# Patient Record
Sex: Female | Born: 1955 | Race: Black or African American | Hispanic: No | Marital: Single
Health system: Southern US, Community
[De-identification: ages and names within clinical notes are randomized; demographics above are authoritative.]

---

## 2013-01-23 ENCOUNTER — Inpatient Hospital Stay: Payer: Self-pay | Admitting: Internal Medicine

## 2013-01-23 LAB — URINALYSIS, COMPLETE
Bilirubin,UR: NEGATIVE
Glucose,UR: NEGATIVE mg/dL (ref 0–75)
LEUKOCYTE ESTERASE: NEGATIVE
Nitrite: POSITIVE
PH: 6 (ref 4.5–8.0)
Protein: NEGATIVE
RBC,UR: 1 /HPF (ref 0–5)
Specific Gravity: 1.013 (ref 1.003–1.030)

## 2013-01-23 LAB — CBC
HCT: 40 % (ref 35.0–47.0)
HGB: 13.5 g/dL (ref 12.0–16.0)
MCH: 29.6 pg (ref 26.0–34.0)
MCHC: 33.7 g/dL (ref 32.0–36.0)
MCV: 88 fL (ref 80–100)
Platelet: 317 10*3/uL (ref 150–440)
RBC: 4.55 10*6/uL (ref 3.80–5.20)
RDW: 14.8 % — ABNORMAL HIGH (ref 11.5–14.5)
WBC: 11.5 10*3/uL — ABNORMAL HIGH (ref 3.6–11.0)

## 2013-01-23 LAB — APTT: Activated PTT: 27.5 secs (ref 23.6–35.9)

## 2013-01-23 LAB — CK TOTAL AND CKMB (NOT AT ARMC)
CK, TOTAL: 76 U/L (ref 21–215)
CK, Total: 76 U/L (ref 21–215)
CK, Total: 73 U/L (ref 21–215)
CK-MB: 0.9 ng/mL (ref 0.5–3.6)
CK-MB: 1.3 ng/mL (ref 0.5–3.6)
CK-MB: 1.4 ng/mL (ref 0.5–3.6)

## 2013-01-23 LAB — TROPONIN I
Troponin-I: <0.02 ng/mL
Troponin-I: 0.12 ng/mL — ABNORMAL HIGH
Troponin-I: 0.08 ng/mL — ABNORMAL HIGH

## 2013-01-23 LAB — COMPREHENSIVE METABOLIC PANEL
ALBUMIN: 3.6 g/dL (ref 3.4–5.0)
ALK PHOS: 83 U/L
ALT: 24 U/L (ref 12–78)
Anion Gap: 9 (ref 7–16)
BILIRUBIN TOTAL: 0.3 mg/dL (ref 0.2–1.0)
BUN: 12 mg/dL (ref 7–18)
CHLORIDE: 105 mmol/L (ref 98–107)
CO2: 23 mmol/L (ref 21–32)
Calcium, Total: 9 mg/dL (ref 8.5–10.1)
Creatinine: 0.72 mg/dL (ref 0.60–1.30)
EGFR (African American): >60
EGFR (Non-African Amer.): >60
Glucose: 160 mg/dL — ABNORMAL HIGH (ref 65–99)
Osmolality: 277 (ref 275–301)
Potassium: 3 mmol/L — ABNORMAL LOW (ref 3.5–5.1)
SGOT(AST): 17 U/L (ref 15–37)
Sodium: 137 mmol/L (ref 136–145)
Total Protein: 7.5 g/dL (ref 6.4–8.2)

## 2013-01-23 LAB — PROTIME-INR
INR: 0.9
PROTHROMBIN TIME: 12.4 s (ref 11.5–14.7)

## 2013-01-23 LAB — MAGNESIUM: MAGNESIUM: 1.7 mg/dL — AB

## 2013-01-23 LAB — HEMOGLOBIN A1C: Hemoglobin A1C: 7.2 % — ABNORMAL HIGH (ref 4.2–6.3)

## 2013-01-23 IMAGING — CR DG CHEST 2V
1 series · 3 of 3 positions shown · non-contrast
Comparison: None.

CLINICAL DATA: 57-year-old female chest pain. Initial encounter.
Palpitations.

EXAM:
CHEST  2 VIEW

[Series 1: w chest pa · 0.14mm/px · 3 of 3 slices shown]
[im 1/3]
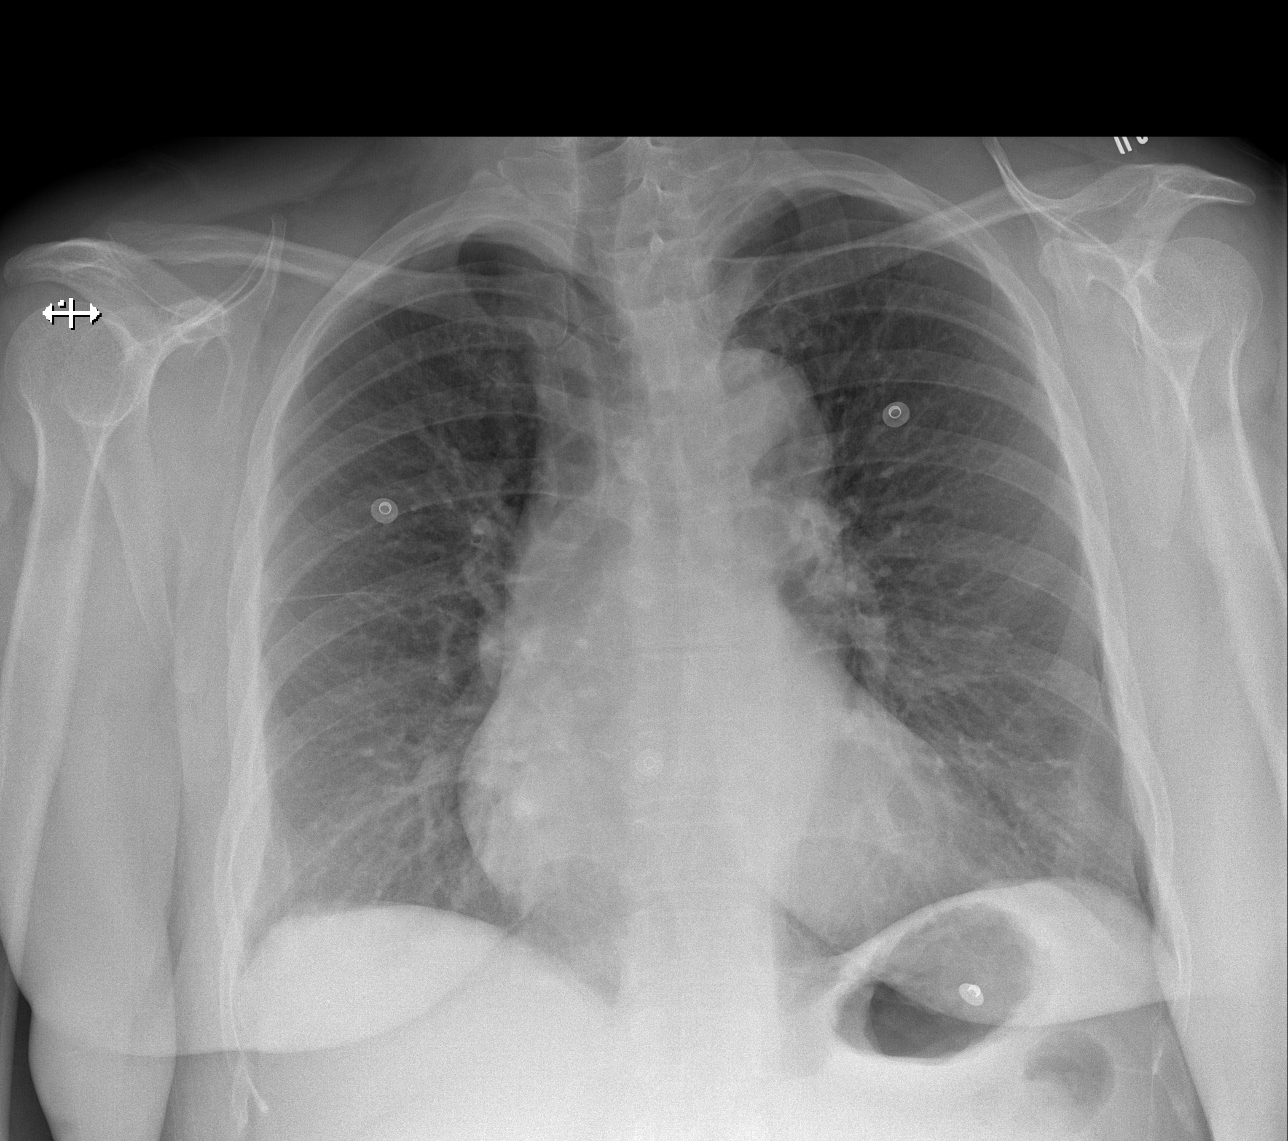
[im 2/3]
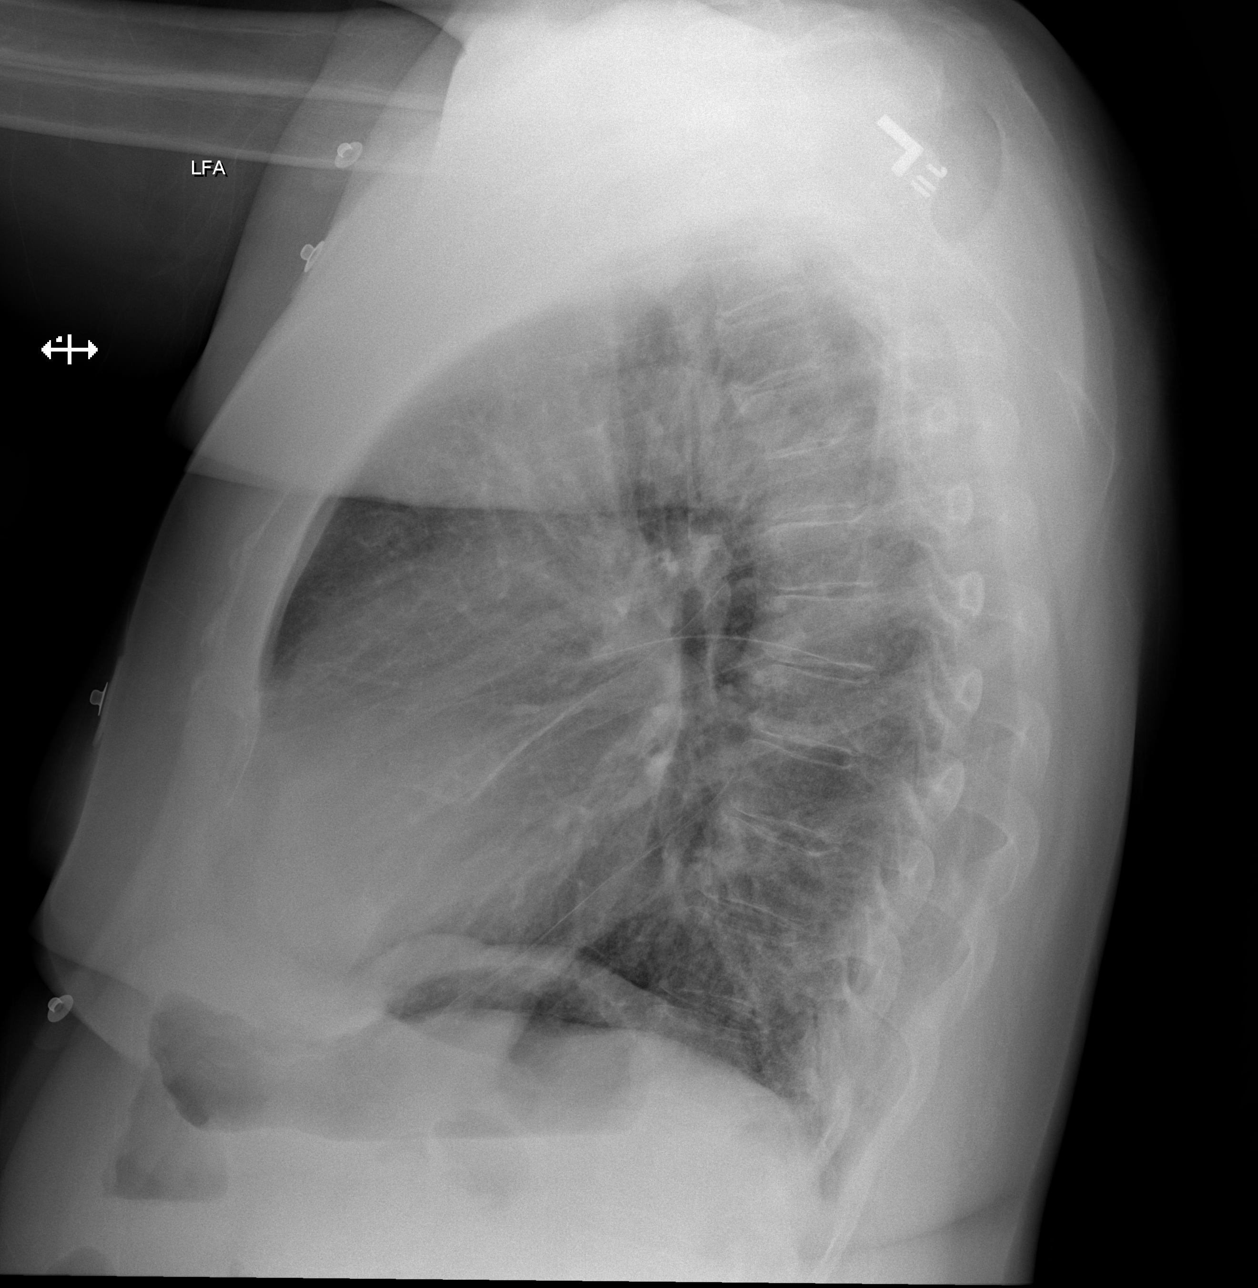
[im 3/3]
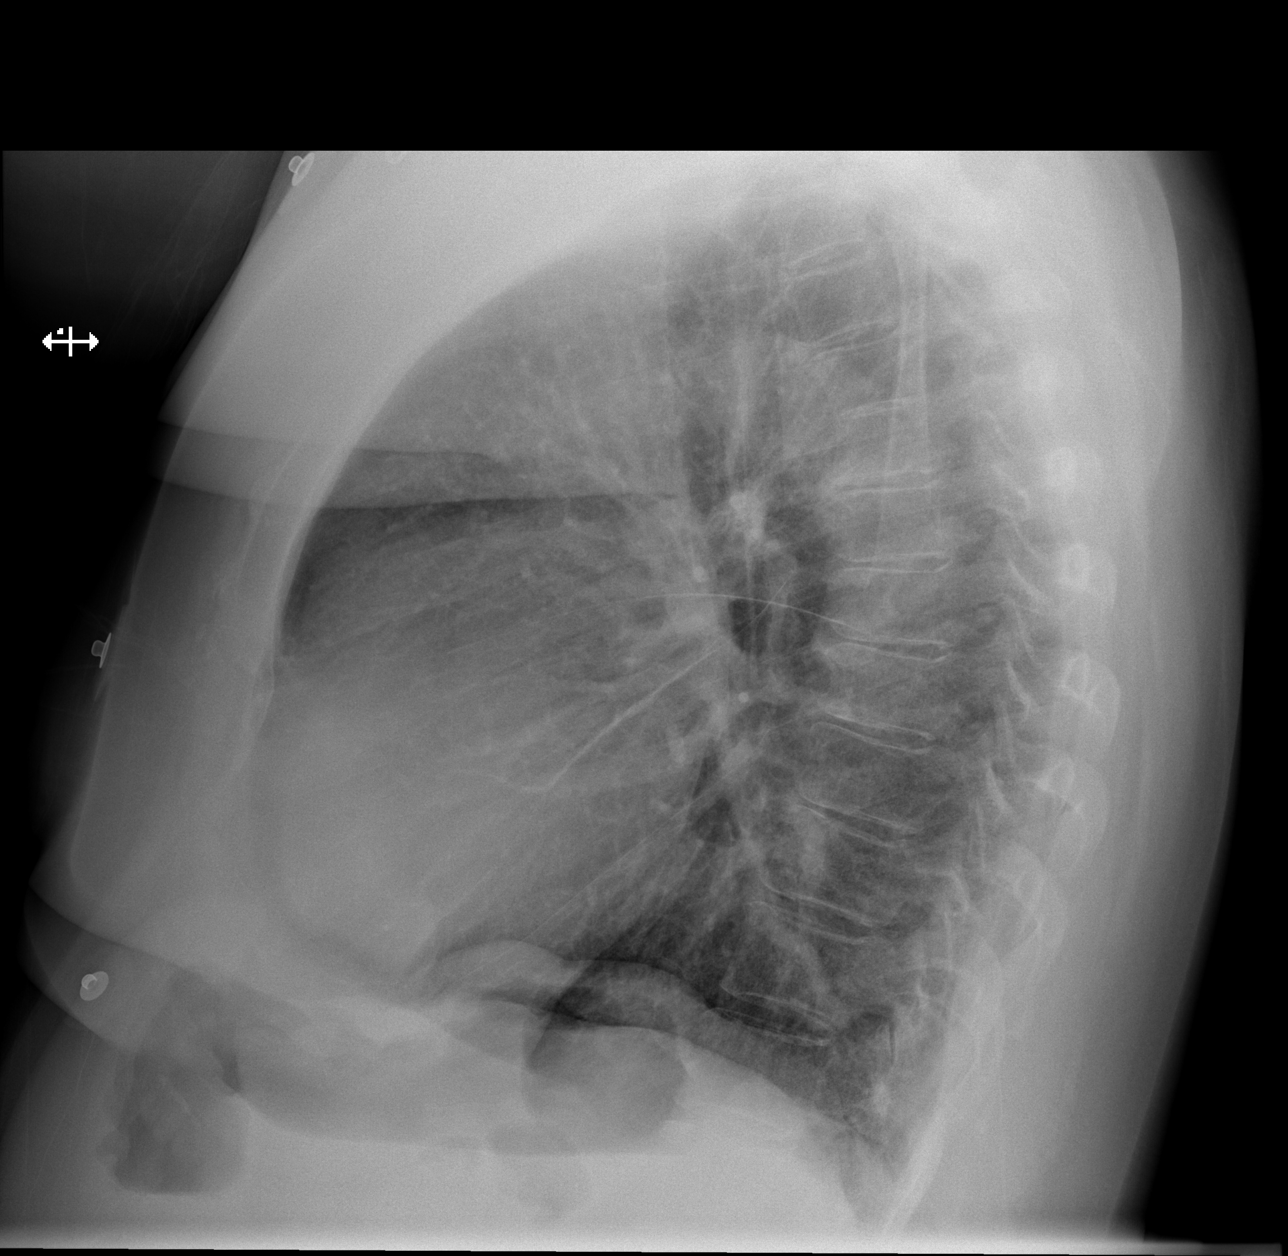

[3 of 3 positions shown; findings below may reference images not displayed]

FINDINGS: Mild to moderate cardiomegaly. Mild tortuosity of the thoracic
aorta. Other mediastinal contours are within normal limits.
Visualized tracheal air column is within normal limits. Mild diffuse
increased interstitial markings. Pulmonary vasculature appears to
remain distinct. No pleural effusion. No pneumothorax or
consolidation. No acute osseous abnormality identified.
IMPRESSION: 1. Cardiomegaly without overt pulmonary edema.
2. Diffuse mild increased pulmonary interstitial markings,
nonspecific.

## 2013-01-24 LAB — CBC WITH DIFFERENTIAL/PLATELET
BASOS ABS: 0.1 10*3/uL (ref 0.0–0.1)
Basophil %: 1.1 %
EOS ABS: 0.2 10*3/uL (ref 0.0–0.7)
Eosinophil %: 2 %
HCT: 37.8 % (ref 35.0–47.0)
HGB: 12.7 g/dL (ref 12.0–16.0)
LYMPHS ABS: 4.9 10*3/uL — AB (ref 1.0–3.6)
Lymphocyte %: 40.5 %
MCH: 29.3 pg (ref 26.0–34.0)
MCHC: 33.5 g/dL (ref 32.0–36.0)
MCV: 88 fL (ref 80–100)
MONOS PCT: 5.2 %
Monocyte #: 0.6 x10 3/mm (ref 0.2–0.9)
Neutrophil #: 6.2 10*3/uL (ref 1.4–6.5)
Neutrophil %: 51.2 %
PLATELETS: 299 10*3/uL (ref 150–440)
RBC: 4.33 10*6/uL (ref 3.80–5.20)
RDW: 14.4 % (ref 11.5–14.5)
WBC: 12.2 10*3/uL — ABNORMAL HIGH (ref 3.6–11.0)

## 2013-01-24 LAB — BASIC METABOLIC PANEL
Anion Gap: 8 (ref 7–16)
BUN: 11 mg/dL (ref 7–18)
CALCIUM: 8.8 mg/dL (ref 8.5–10.1)
Chloride: 103 mmol/L (ref 98–107)
Co2: 24 mmol/L (ref 21–32)
Creatinine: 0.57 mg/dL — ABNORMAL LOW (ref 0.60–1.30)
EGFR (African American): >60
EGFR (Non-African Amer.): >60
GLUCOSE: 132 mg/dL — AB (ref 65–99)
Osmolality: 271 (ref 275–301)
Potassium: 3.5 mmol/L (ref 3.5–5.1)
Sodium: 135 mmol/L — ABNORMAL LOW (ref 136–145)

## 2013-01-24 LAB — APTT: ACTIVATED PTT: 92.7 s — AB (ref 23.6–35.9)

## 2013-01-24 LAB — LIPID PANEL
Cholesterol: 236 mg/dL — ABNORMAL HIGH (ref 0–200)
HDL Cholesterol: 68 mg/dL — ABNORMAL HIGH (ref 40–60)
LDL CHOLESTEROL, CALC: 140 mg/dL — AB (ref 0–100)
Triglycerides: 141 mg/dL (ref 0–200)
VLDL CHOLESTEROL, CALC: 28 mg/dL (ref 5–40)

## 2013-01-26 LAB — CBC WITH DIFFERENTIAL/PLATELET
Basophil #: 0.1 10*3/uL (ref 0.0–0.1)
Basophil %: 1.4 %
Eosinophil #: 0.2 10*3/uL (ref 0.0–0.7)
Eosinophil %: 2 %
HCT: 38.6 % (ref 35.0–47.0)
HGB: 12.9 g/dL (ref 12.0–16.0)
LYMPHS ABS: 3.1 10*3/uL (ref 1.0–3.6)
Lymphocyte %: 30 %
MCH: 29.7 pg (ref 26.0–34.0)
MCHC: 33.5 g/dL (ref 32.0–36.0)
MCV: 89 fL (ref 80–100)
MONO ABS: 0.6 x10 3/mm (ref 0.2–0.9)
Monocyte %: 5.8 %
NEUTROS PCT: 60.8 %
Neutrophil #: 6.3 10*3/uL (ref 1.4–6.5)
Platelet: 293 10*3/uL (ref 150–440)
RBC: 4.34 10*6/uL (ref 3.80–5.20)
RDW: 14.3 % (ref 11.5–14.5)
WBC: 10.4 10*3/uL (ref 3.6–11.0)

## 2013-01-26 LAB — BASIC METABOLIC PANEL
Anion Gap: 6 — ABNORMAL LOW (ref 7–16)
BUN: 13 mg/dL (ref 7–18)
Calcium, Total: 9.2 mg/dL (ref 8.5–10.1)
Chloride: 105 mmol/L (ref 98–107)
Co2: 24 mmol/L (ref 21–32)
Creatinine: 0.68 mg/dL (ref 0.60–1.30)
EGFR (African American): >60
GLUCOSE: 140 mg/dL — AB (ref 65–99)
OSMOLALITY: 273 (ref 275–301)
POTASSIUM: 3.8 mmol/L (ref 3.5–5.1)
Sodium: 135 mmol/L — ABNORMAL LOW (ref 136–145)

## 2014-05-01 NOTE — Discharge Summary (Signed)
PATIENT NAME:  Alexa Guerrero, Alexa Guerrero MR#:  [id number] DATE OF BIRTH:  1955/08/19  DATE OF ADMISSION:  01/23/2013 DATE OF DISCHARGE:  01/26/2013  PRIMARY CARE PHYSICIAN: From [facility] in [place].  DISCHARGE DIAGNOSES:  1.  Chest pain.  2.  Angina.  3.  Hypertension.  4.  Diabetes.  5.  Hyperlipidemia.    CONDITION: Stable.   CODE STATUS: Full code.   HOME MEDICATIONS: Please refer to the [facility] physician discharge instructions, medication reconciliation list.   DIET: Low sodium, low fat, low cholesterol, ADA diet.   ACTIVITY: As tolerated.   FOLLOW-UP CARE: Follow up is needed 1 to 2 weeks. Follow up with Dr. Welton FlakesKhan, cardiologist, within one week.   PROCEDURE: Cardiac catheterization.   CHIEF COMPLAINT: Chest pain.   HOSPITAL COURSE: The patient is a 59 year old African American female with a history of hypertension, diabetes, and high cholesterol, presented to the Emergency Department with chest pain and near syncope. The patient came to the Emergency Department for further evaluation and was noted to have an elevated troponin at 0.12. The patient was admitted for non-STEMI.  For detailed history and physical examination, please refer to the admission note dictated by Dr. Luberta MutterKonidena. On admission date, the patient's chest x-ray showed cardiomegaly, resolved pulmonary edema. D-dimer was 0.39. First troponin was less than 0.02 . Second troponin 0.12. EKG showed flattened ST segments in V4 to V6.   For the chest pain and angina, after admission, the patient has been treated with aspirin, beta blocker, statin, nitroglycerin p.r.n., heparin drip. Dr. Welton FlakesKhan evaluated the patient and suggested a  stress test, which showed reversible lesion, so Dr. Welton FlakesKhan did a cardiac catheterization, which did not show any stenosis. Dr. Welton FlakesKhan suggested follow up with him as outpatient.   The patient's hypertension has been treated with beta blocker, has been stable.   Diabetes has been treated with  sliding scale and diet control.   Hyperlipidemia. The patient's LDL is 140. She has been treated with atorvastatin.   Patient underwent cardiac catheterization, without complication. The patient has no complaints. Vital signs are stable. She is clinically stable and will be discharged to home today. I discussed the patient's discharge plan with the patient, nurse, case manager.   TIME SPENT: About 36 minutes.     ____________________________ Shaune PollackQing Merly Hinkson, MD qc:cg D: 01/26/2013 17:28:56 ET T: 01/27/2013 06:12:21 ET JOB#: [id number]  cc: Shaune PollackQing Jovie Swanner, MD, <Dictator> Shaune PollackQING Mishawn Didion MD ELECTRONICALLY SIGNED 01/27/2013 17:05
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
